# Patient Record
Sex: Female | Born: 1975 | Hispanic: Yes | Smoking: Never smoker
Health system: Southern US, Community
[De-identification: ages and names within clinical notes are randomized; demographics above are authoritative.]

---

## 2016-10-01 ENCOUNTER — Emergency Department (HOSPITAL_COMMUNITY): Payer: Worker's Compensation

## 2016-10-01 ENCOUNTER — Encounter (HOSPITAL_COMMUNITY): Payer: Self-pay | Admitting: Emergency Medicine

## 2016-10-01 ENCOUNTER — Emergency Department (HOSPITAL_COMMUNITY)
Admission: EM | Admit: 2016-10-01 | Discharge: 2016-10-01 | Disposition: A | Discharge status: Home / Self Care | Payer: Worker's Compensation | Attending: Emergency Medicine | Admitting: Emergency Medicine

## 2016-10-01 DIAGNOSIS — D649 Anemia, unspecified: Secondary | ICD-10-CM | POA: Insufficient documentation

## 2016-10-01 DIAGNOSIS — T719XXA Asphyxiation due to unspecified cause, initial encounter: Secondary | ICD-10-CM | POA: Insufficient documentation

## 2016-10-01 DIAGNOSIS — Y929 Unspecified place or not applicable: Secondary | ICD-10-CM | POA: Insufficient documentation

## 2016-10-01 DIAGNOSIS — Y9389 Activity, other specified: Secondary | ICD-10-CM | POA: Diagnosis not present

## 2016-10-01 DIAGNOSIS — S0990XA Unspecified injury of head, initial encounter: Secondary | ICD-10-CM | POA: Diagnosis not present

## 2016-10-01 DIAGNOSIS — W3189XA Contact with other specified machinery, initial encounter: Secondary | ICD-10-CM | POA: Insufficient documentation

## 2016-10-01 DIAGNOSIS — Y990 Civilian activity done for income or pay: Secondary | ICD-10-CM | POA: Insufficient documentation

## 2016-10-01 LAB — CBC WITH DIFFERENTIAL/PLATELET
BASOS PCT: 0 %
Basophils Absolute: 0 10*3/uL (ref 0.0–0.1)
EOS PCT: 2 %
Eosinophils Absolute: 0.1 10*3/uL (ref 0.0–0.7)
HEMATOCRIT: 28 % — AB (ref 36.0–46.0)
HEMOGLOBIN: 8.2 g/dL — AB (ref 12.0–15.0)
LYMPHS PCT: 36 %
Lymphs Abs: 2.6 10*3/uL (ref 0.7–4.0)
MCH: 19.1 pg — AB (ref 26.0–34.0)
MCHC: 29.3 g/dL — ABNORMAL LOW (ref 30.0–36.0)
MCV: 65.1 fL — AB (ref 78.0–100.0)
MONO ABS: 0.4 10*3/uL (ref 0.1–1.0)
MONOS PCT: 5 %
NEUTROS PCT: 57 %
Neutro Abs: 4.2 10*3/uL (ref 1.7–7.7)
PLATELETS: 416 10*3/uL — AB (ref 150–400)
RBC: 4.3 MIL/uL (ref 3.87–5.11)
RDW: 19.4 % — ABNORMAL HIGH (ref 11.5–15.5)
WBC: 7.3 10*3/uL (ref 4.0–10.5)

## 2016-10-01 LAB — I-STAT CHEM 8, ED
BUN: 19 mg/dL (ref 6–20)
CHLORIDE: 102 mmol/L (ref 101–111)
Calcium, Ion: 1.09 mmol/L — ABNORMAL LOW (ref 1.15–1.40)
Creatinine, Ser: 0.6 mg/dL (ref 0.44–1.00)
Glucose, Bld: 96 mg/dL (ref 65–99)
HCT: 27 % — ABNORMAL LOW (ref 36.0–46.0)
Hemoglobin: 9.2 g/dL — ABNORMAL LOW (ref 12.0–15.0)
POTASSIUM: 3.2 mmol/L — AB (ref 3.5–5.1)
Sodium: 138 mmol/L (ref 135–145)
TCO2: 24 mmol/L (ref 0–100)

## 2016-10-01 LAB — I-STAT BETA HCG BLOOD, ED (MC, WL, AP ONLY)

## 2016-10-01 MED ORDER — METHOCARBAMOL 500 MG PO TABS: 500.0000 mg | ORAL_TABLET | Freq: Three times a day (TID) | ORAL | 0 refills | Status: AC | PRN

## 2016-10-01 MED ORDER — IOPAMIDOL (ISOVUE-370) INJECTION 76%
INTRAVENOUS | Status: AC
  Administered 2016-10-01: 50 mL
  Filled 2016-10-01: qty 50

## 2016-10-01 MED ORDER — KETOROLAC TROMETHAMINE 30 MG/ML IJ SOLN
30.0000 mg | Freq: Once | INTRAMUSCULAR | Status: AC
  Administered 2016-10-01: 30 mg via INTRAVENOUS
  Filled 2016-10-01: qty 1

## 2016-10-01 NOTE — Progress Notes (Signed)
Orthopedic Tech Progress Note Patient Details:  Natalie Williams 05/12/1976 [id number]  Patient ID: Natalie Williams, female   DOB: 12/30/1975, 40 y.o.   MRN: [id number]   Natalie Williams, Natalie Williams 10/01/2016, 9:57 AM Made level 2 trauma visit
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
## 2016-10-01 NOTE — Discharge Instructions (Signed)
Watch for increased neck pain or increased swelling. He'll probably have aches and pains for a while. Watch for increased trouble breathing. He were found to be anemic also. This will need to get followed.
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
## 2016-10-01 NOTE — ED Notes (Signed)
Patient transported to CT 
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
